# Patient Record
Sex: Male | Born: 1995 | Race: White | Hispanic: No | Marital: Single | Smoking: Never smoker
Health system: Southern US, Community
[De-identification: ages and names within clinical notes are randomized; demographics above are authoritative.]

---
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
## 2015-01-23 ENCOUNTER — Telehealth: Payer: Self-pay | Admitting: Family Medicine

## 2015-01-23 NOTE — Telephone Encounter (Signed)
Mom called stating that she got patients insurance straight. [employer] insurance.

## 2019-06-04 ENCOUNTER — Emergency Department
Admission: EM | Admit: 2019-06-04 | Discharge: 2019-06-05 | Disposition: A | Discharge status: Home / Self Care | Payer: Self-pay | Attending: Student | Admitting: Student

## 2019-06-04 ENCOUNTER — Encounter: Payer: Self-pay | Admitting: Emergency Medicine

## 2019-06-04 ENCOUNTER — Other Ambulatory Visit: Payer: Self-pay

## 2019-06-04 ENCOUNTER — Emergency Department: Payer: Self-pay

## 2019-06-04 DIAGNOSIS — R29898 Other symptoms and signs involving the musculoskeletal system: Secondary | ICD-10-CM | POA: Insufficient documentation

## 2019-06-04 DIAGNOSIS — G589 Mononeuropathy, unspecified: Secondary | ICD-10-CM | POA: Insufficient documentation

## 2019-06-04 LAB — BASIC METABOLIC PANEL
Anion gap: 14 (ref 5–15)
BUN: 12 mg/dL (ref 6–20)
CO2: 27 mmol/L (ref 22–32)
Calcium: 9.7 mg/dL (ref 8.9–10.3)
Chloride: 102 mmol/L (ref 98–111)
Creatinine, Ser: 0.99 mg/dL (ref 0.61–1.24)
GFR calc Af Amer: >60 mL/min (ref >60)
GFR calc non Af Amer: >60 mL/min (ref >60)
Glucose, Bld: 91 mg/dL (ref 70–99)
Potassium: 3.8 mmol/L (ref 3.5–5.1)
Sodium: 143 mmol/L (ref 135–145)

## 2019-06-04 LAB — CBC
HCT: 44.9 % (ref 39.0–52.0)
Hemoglobin: 15.6 g/dL (ref 13.0–17.0)
MCH: 30.4 pg (ref 26.0–34.0)
MCHC: 34.7 g/dL (ref 30.0–36.0)
MCV: 87.4 fL (ref 80.0–100.0)
Platelets: 252 10*3/uL (ref 150–400)
RBC: 5.14 MIL/uL (ref 4.22–5.81)
RDW: 12.2 % (ref 11.5–15.5)
WBC: 9 10*3/uL (ref 4.0–10.5)
nRBC: 0 % (ref 0.0–0.2)

## 2019-06-04 MED ORDER — LORAZEPAM 1 MG PO TABS
1.0000 mg | ORAL_TABLET | Freq: Once | ORAL | Status: AC
  Administered 2019-06-04: 1 mg via ORAL
  Filled 2019-06-04: qty 1

## 2019-06-04 MED ORDER — METHYLPREDNISOLONE SODIUM SUCC 125 MG IJ SOLR
125.0000 mg | Freq: Once | INTRAMUSCULAR | Status: AC
  Administered 2019-06-04: 125 mg via INTRAVENOUS
  Filled 2019-06-04: qty 2

## 2019-06-04 MED ORDER — GADOBUTROL 1 MMOL/ML IV SOLN
6.0000 mL | Freq: Once | INTRAVENOUS | Status: AC | PRN
  Administered 2019-06-04: 6 mL via INTRAVENOUS

## 2019-06-04 NOTE — ED Provider Notes (Signed)
[facility] Emergency Department Provider Note  ____________________________________________  Time seen: Approximately 10:25 PM  I have reviewed the triage vital signs and the nursing notes.   HISTORY  Chief Complaint Tingling    HPI Devin Mitchell is a 23 y.o. male with an unremarkable past medical history, presents to the emergency department with sudden onset right hand weakness.  Patient reports that he was going to get a drink and suddenly could not hold his cup or spread his fingers well.  Patient states that since incident has occurred, his right wrist and right hand has felt numb and "like it is asleep.  He has had difficulty moving his thumb.  He denies a history of carpal tunnel and states that he does not experience chronic wrist pain.  He has never experienced right upper extremity weakness in the past.  No falls or traumas.  No fever at home.  He denies changes in sleeping position.  Denies current headache or numbness or tingling in the left upper extremity or lower legs.  No bowel or bladder incontinence.  No other alleviating measures have been attempted.         History reviewed. No pertinent past medical history.  There are no active problems to display for this patient.   Past Surgical History:  Procedure Laterality Date  . WISDOM TOOTH EXTRACTION      Prior to Admission medications   Not on File    Allergies Patient has no known allergies.  No family history on file.  Social History Social History   Tobacco Use  . Smoking status: Never Smoker  . Smokeless tobacco: Never Used  Substance Use Topics  . Alcohol use: Yes  . Drug use: Not on file     Review of Systems  Constitutional: No fever/chills Eyes: No visual changes. No discharge ENT: No upper respiratory complaints. Cardiovascular: no chest pain. Respiratory: no cough. No SOB. Gastrointestinal: No abdominal pain.  No nausea, no vomiting.  No diarrhea.  No  constipation. Genitourinary: Negative for dysuria. No hematuria Musculoskeletal: Patient has right hand weakness.  Skin: Negative for rash, abrasions, lacerations, ecchymosis. Neurological: Negative for headaches, focal weakness or numbness.   ____________________________________________   PHYSICAL EXAM:  VITAL SIGNS: ED Triage Vitals  Enc Vitals Group     BP 06/04/19 1603 116/80     Pulse Rate 06/04/19 1603 86     Resp 06/04/19 1603 18     Temp 06/04/19 1603 98.1 F (36.7 C)     Temp Source 06/04/19 1603 Oral     SpO2 06/04/19 1603 96 %     Weight 06/04/19 1602 145 lb (65.8 kg)     Height 06/04/19 1602 6\' 1"  (1.854 m)     Head Circumference --      Peak Flow --      Pain Score --      Pain Loc --      Pain Edu? --      Excl. in [place]? --      Constitutional: Alert and oriented. Well appearing and in no acute distress. Eyes: Patient has perilimbal redness on the right.PERRL. EOMI. Head: Atraumatic. ENT:      Nose: No congestion/rhinnorhea.      Mouth/Throat: Mucous membranes are moist.  Neck: No stridor.  Patient states that radiculopathy is worsened with range of motion at the neck. Cardiovascular: Normal rate, regular rhythm. Normal S1 and S2.  Good peripheral circulation. Respiratory: Normal respiratory effort without tachypnea or  retractions. Lungs CTAB. Good air entry to the bases with no decreased or absent breath sounds. Gastrointestinal: Bowel sounds 4 quadrants. Soft and nontender to palpation. No guarding or rigidity. No palpable masses. No distention. No CVA tenderness. Musculoskeletal: Patient has 3 out of 5 grip strength on the right and 5 out of 5 grip strength on the left.  Patient can perform full range of motion at the right wrist.  He has difficulty spreading his right fingers and performing abduction at the thumb.  He can perform opposition and can perform flexion at the IP joint of the right thumb.  Patient reports that radiculopathy does worsen with Tinel  and Phalen's however he has numbness and tingling of all 5 right digits. Neurologic: Sensation intact to light and deep touch. Skin:  Skin is warm, dry and intact. No rash noted. Psychiatric: Mood and affect are normal. Speech and behavior are normal. Patient exhibits appropriate insight and judgement.   ____________________________________________   LABS (all labs ordered are listed, but only abnormal results are displayed)  Labs Reviewed  CBC  BASIC METABOLIC PANEL   ____________________________________________  EKG   ____________________________________________  RADIOLOGY I personally viewed and evaluated these images as part of my medical decision making, as well as reviewing the written report by the radiologist.    No results found.  ____________________________________________    PROCEDURES  Procedure(s) performed:    Procedures    Medications  gadobutrol (GADAVIST) 1 MMOL/ML injection 6 mL (has no administration in time range)  methylPREDNISolone sodium succinate (SOLU-MEDROL) 125 mg/2 mL injection 125 mg (125 mg Intravenous Given 06/04/19 2143)  LORazepam (ATIVAN) tablet 1 mg (1 mg Oral Given 06/04/19 2137)     ____________________________________________   INITIAL IMPRESSION / ASSESSMENT AND PLAN / ED COURSE  Pertinent labs & imaging results that were available during my care of the patient were reviewed by me and considered in my medical decision making (see chart for details).  Review of the [place] CSRS was performed in accordance of the [facility] prior to dispensing any controlled drugs.         Assessment and Plan: Radiculopathy:  23 year old male presents to the emergency department with sudden onset right hand weakness.  Vital signs were reassuring at triage.  On physical exam, patient had radiculopathy of all 5 right digits and had reduced grip strength on the right.  He had difficulty performing abduction at the right thumb.  There is no  significant wasting of the right thenar eminence.  Differential diagnosis includes MS, carpal tunnel, CVA, cervical radiculopathy...  Basic labs were obtained which were reassuring.  MRI of brain and cervical spine were ordered.  Patient care was transitioned to attending Dr. Derrill Kay as flex transition to close.   ____________________________________________  FINAL CLINICAL IMPRESSION(S) / ED DIAGNOSES  Final diagnoses:  Radiculopathy, unspecified spinal region      NEW MEDICATIONS STARTED DURING THIS VISIT:  ED Discharge Orders    None          This chart was dictated using voice recognition software/Dragon. Despite best efforts to proofread, errors can occur which can change the meaning. Any change was purely unintentional.    Orvil Feil, PA-C 06/04/19 2311    Miguel Aschoff., MD 06/05/19 0001

## 2019-06-04 NOTE — ED Triage Notes (Signed)
Pt arrived via POV with reports of numbness/tingling to right wrist and fingers.  Pt states this morning he went to the store to get a drink, he was not able to hold his drink and spilled it at around 0700.  Pt does have decreased grip strength on the right side. Pt denies any other sxs at this time.  Pt denies any hx of injury to right arm, wrist or hand. No slurred speech.

## 2019-06-04 NOTE — ED Notes (Signed)
Patient transported to MRI 

## 2019-06-04 NOTE — ED Notes (Signed)
D/w Dr. Joan Mayans, orders received for labs and EKG at this time. No other orders given.

## 2019-06-05 NOTE — ED Provider Notes (Signed)
MRIs negative. Discussed this with the patient. At this time given negative MRI do think it is reasonable for patient to be discharged. Discussed neurology follow up for further work up and management.    Nance Pear, MD 06/05/19 [phone]

## 2019-06-05 NOTE — Discharge Instructions (Addendum)
Please seek medical attention for any high fevers, chest pain, shortness of breath, change in behavior, persistent vomiting, bloody stool or any other new or concerning symptoms.  

## 2019-07-18 ENCOUNTER — Ambulatory Visit: Payer: Self-pay | Attending: Internal Medicine

## 2019-07-18 DIAGNOSIS — U071 COVID-19: Secondary | ICD-10-CM | POA: Insufficient documentation

## 2019-07-18 DIAGNOSIS — Z20822 Contact with and (suspected) exposure to covid-19: Secondary | ICD-10-CM

## 2019-07-19 LAB — NOVEL CORONAVIRUS, NAA: SARS-CoV-2, NAA: DETECTED — AB

## 2021-04-13 IMAGING — MR MR CERVICAL SPINE W/O CM
5 series · 38 of 48 positions shown · IV contrast (gadavist)
Comparison: None.

CLINICAL DATA: Numbness and tingling of the right upper extremity

EXAM:
MRI HEAD WITHOUT AND WITH CONTRAST
MRI CERVICAL SPINE WITHOUT CONTRAST
TECHNIQUE: Multiplanar, multiecho pulse sequences of the brain and surrounding
structures were obtained without and with intravenous contrast.
Multiplanar, multi-echo pulse sequences of the cervical spine were
obtained without intravenous contrast.
CONTRAST:  6mL GADAVIST GADOBUTROL 1 MMOL/ML IV SOLN

[Series 1: T2 · sagittal · 3.0mm · 0.62mm/px · 8 of 17 slices shown (1 of 2)]
[im 1/17]
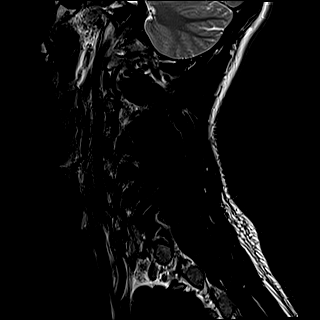
[im 3/17]
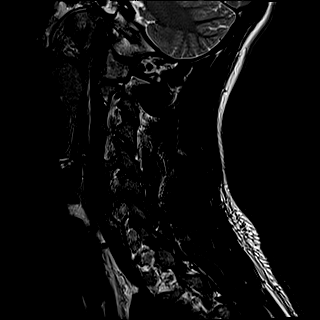
[im 5/17]
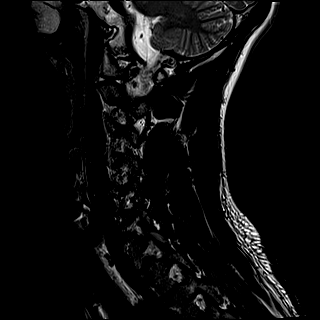
[im 7/17]
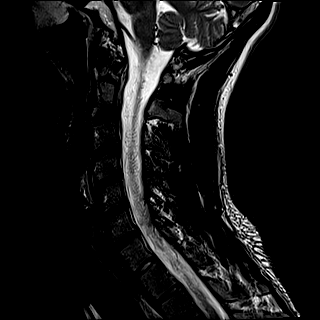
[im 10/17]
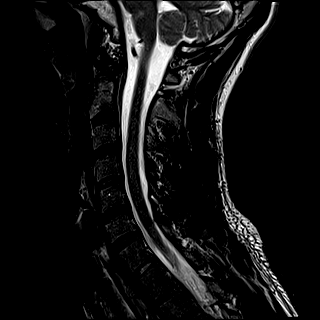
[im 12/17]
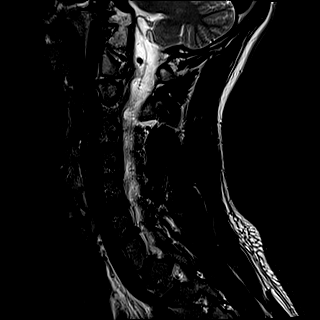
[im 14/17]
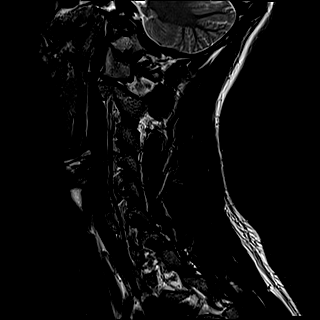
[im 17/17]
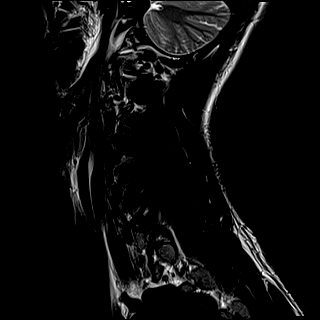

[Series 2: FLAIR · sagittal · 3.0mm · 0.78mm/px · 7 of 17 slices shown]
[im 1/17]
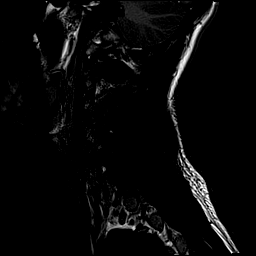
[im 3/17]
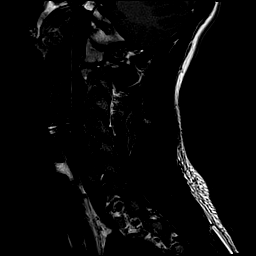
[im 6/17]
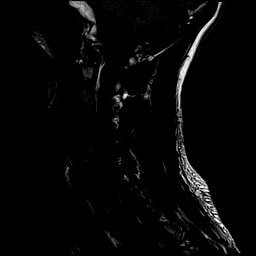
[im 9/17]
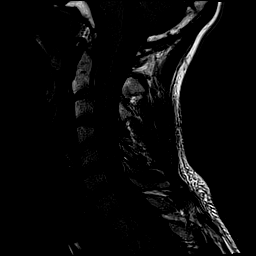
[im 11/17]
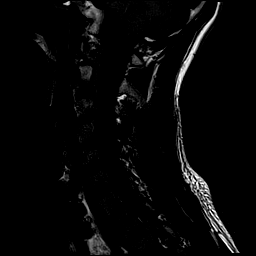
[im 14/17]
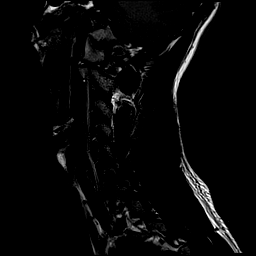
[im 17/17]
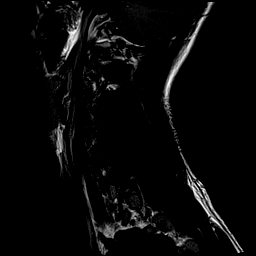

[Series 3: STIR · sagittal · 3.0mm · 0.62mm/px · 7 of 17 slices shown]
[im 1/17]
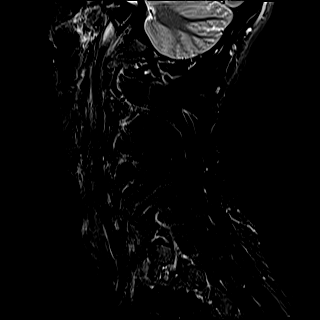
[im 3/17]
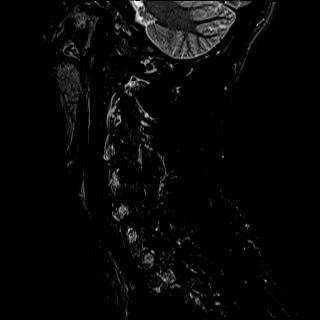
[im 6/17]
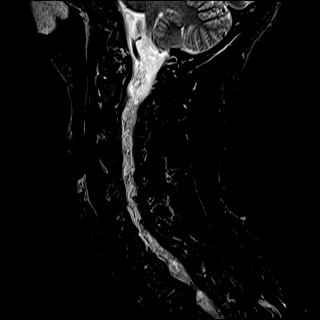
[im 9/17]
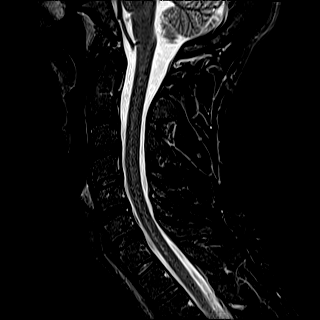
[im 11/17]
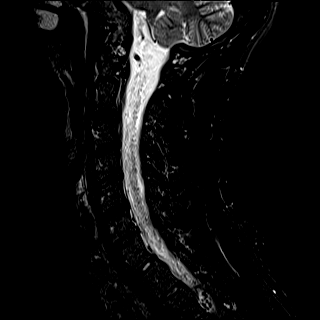
[im 14/17]
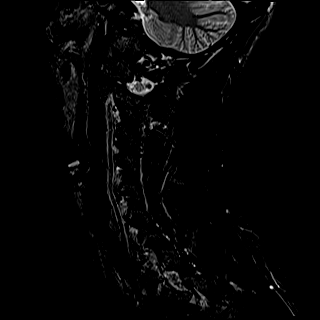
[im 17/17]
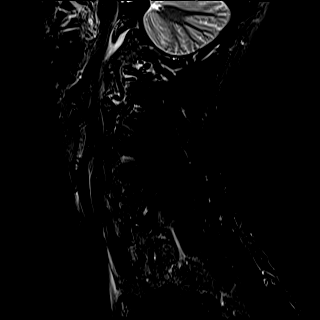

[Series 4: T2 · axial · 3.0mm · 0.70mm/px · z∈[-217,-119]mm · 9 of 32 slices shown (2 of 2)]
[im 1/32]
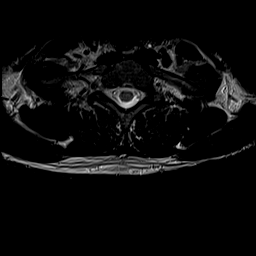
[im 6/32]
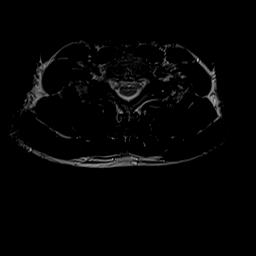
[im 11/32]
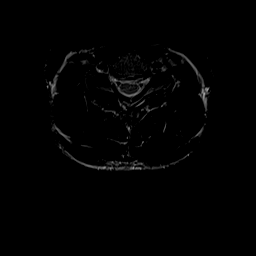
[im 13/32]
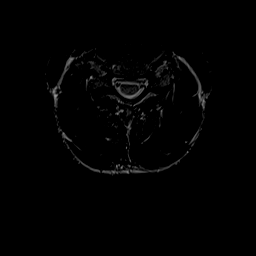
[im 16/32]
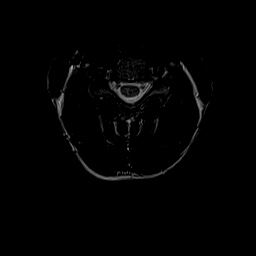
[im 19/32]
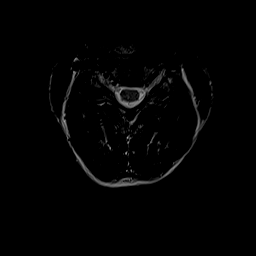
[im 21/32]
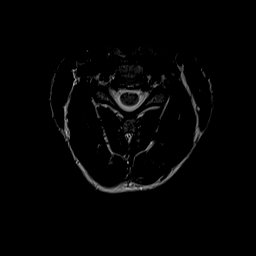
[im 26/32]
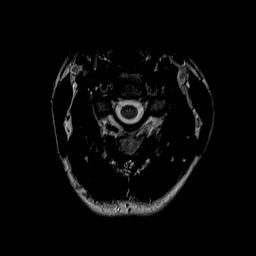
[im 32/32]
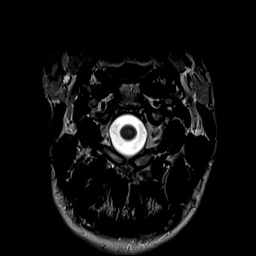

[Series 5: ax mpgr · axial · 3.0mm · 0.35mm/px · z∈[-217,-138]mm · 7 of 32 slices shown]
[im 1/32]
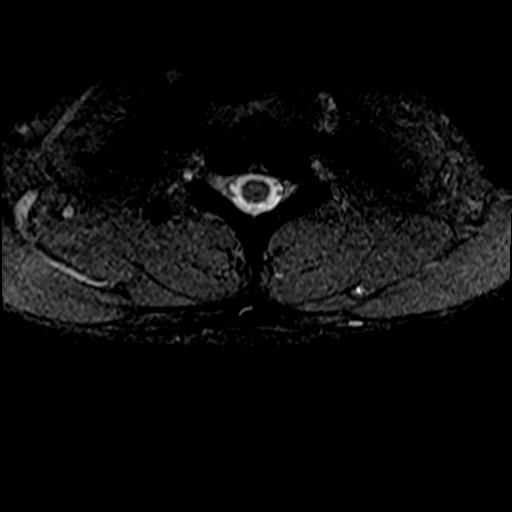
[im 6/32]
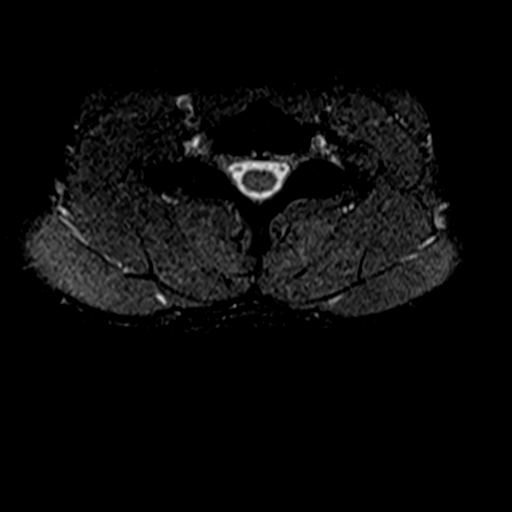
[im 11/32]
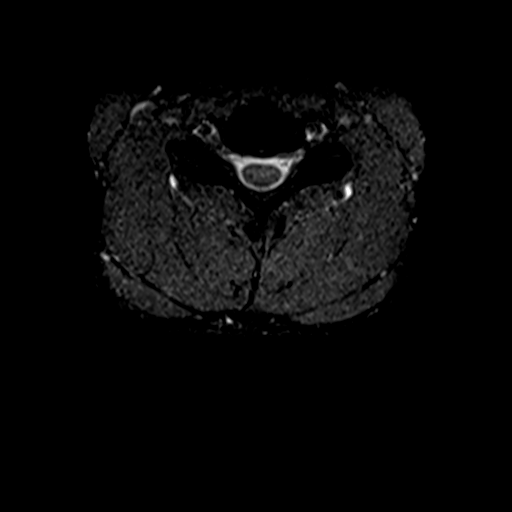
[im 13/32]
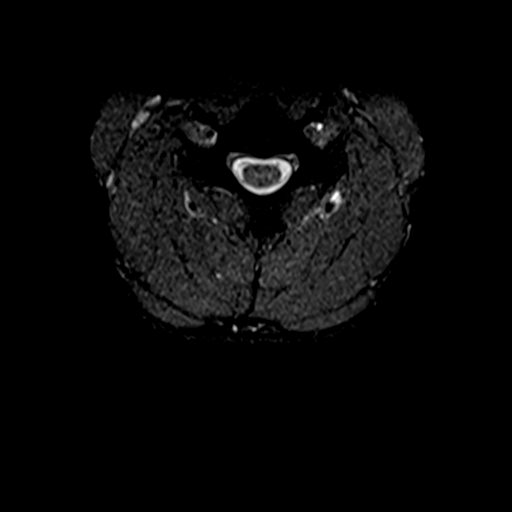
[im 19/32]
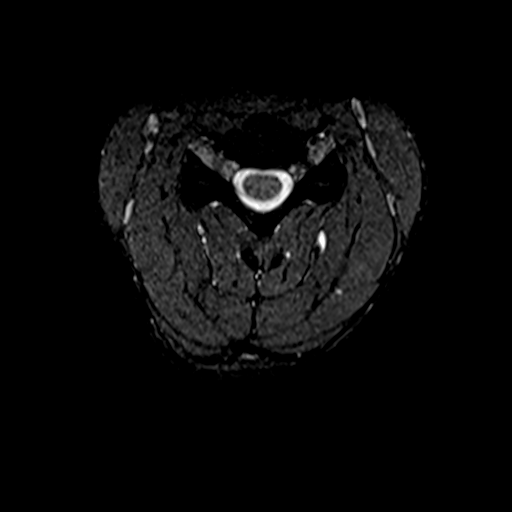
[im 21/32]
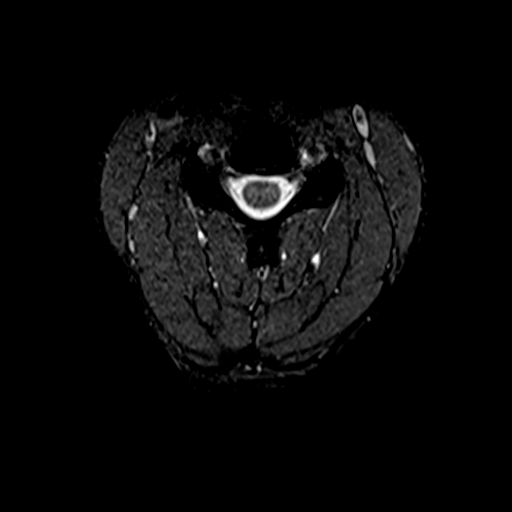
[im 26/32]
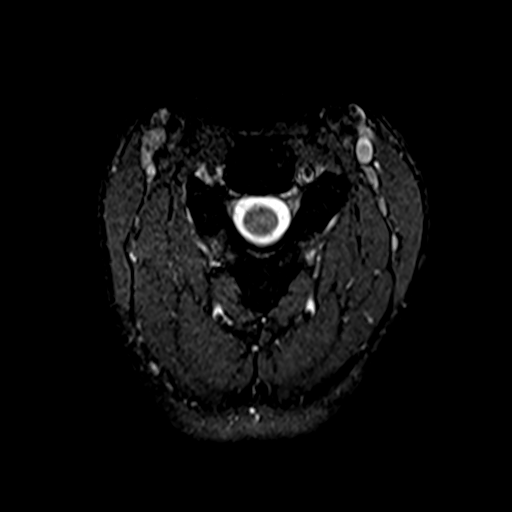

[38 of 48 positions shown; findings below may reference images not displayed]

FINDINGS: MRI HEAD FINDINGS

BRAIN: There is no acute infarct, acute hemorrhage or extra-axial
collection. The white matter signal is normal for the patient's age.
The cerebral and cerebellar volume are age-appropriate. There is no
hydrocephalus. The midline structures are normal. There is no
abnormal contrast enhancement.

VASCULAR: The major intracranial arterial and venous sinus flow
voids are normal. Susceptibility-sensitive sequences show no chronic
microhemorrhage or superficial siderosis.

SKULL AND UPPER CERVICAL SPINE: Calvarial bone marrow signal is
normal. There is no skull base mass. The visualized upper cervical
spine and soft tissues are normal.

SINUSES/ORBITS: There are no fluid levels or advanced mucosal
thickening. The mastoid air cells and middle ear cavities are free
of fluid. The orbits are normal.

MRI CERVICAL SPINE FINDINGS

Alignment: Physiologic.

Vertebrae: No fracture, evidence of discitis, or bone lesion.

Cord: Normal signal and morphology.

Posterior Fossa, vertebral arteries, paraspinal tissues: Negative.

Disc levels: There is no disc herniation, spinal canal stenosis or
neural impingement.
IMPRESSION: Normal MRI of the brain and cervical spine.

## 2023-02-26 ENCOUNTER — Other Ambulatory Visit: Payer: Self-pay | Admitting: Internal Medicine

## 2023-02-26 DIAGNOSIS — N5089 Other specified disorders of the male genital organs: Secondary | ICD-10-CM
